# Patient Record
Sex: Male | Born: 1995 | Race: White | Hispanic: No | Marital: Single | Smoking: Never smoker
Health system: Southern US, Community
[De-identification: ages and names within clinical notes are randomized; demographics above are authoritative.]

## PROBLEM LIST (undated history)

## (undated) DIAGNOSIS — J45909 Unspecified asthma, uncomplicated: Secondary | ICD-10-CM

---
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
## 2018-04-07 DIAGNOSIS — Z7289 Other problems related to lifestyle: Secondary | ICD-10-CM | POA: Diagnosis not present

## 2018-04-07 DIAGNOSIS — K219 Gastro-esophageal reflux disease without esophagitis: Secondary | ICD-10-CM | POA: Diagnosis not present

## 2018-04-07 DIAGNOSIS — Z0000 Encounter for general adult medical examination without abnormal findings: Secondary | ICD-10-CM | POA: Diagnosis not present

## 2018-04-07 DIAGNOSIS — Q846 Other congenital malformations of nails: Secondary | ICD-10-CM | POA: Diagnosis not present

## 2019-04-09 DIAGNOSIS — Z130 Encounter for screening for diseases of the blood and blood-forming organs and certain disorders involving the immune mechanism: Secondary | ICD-10-CM | POA: Diagnosis not present

## 2019-04-09 DIAGNOSIS — Z0000 Encounter for general adult medical examination without abnormal findings: Secondary | ICD-10-CM | POA: Diagnosis not present

## 2019-04-13 DIAGNOSIS — Z0000 Encounter for general adult medical examination without abnormal findings: Secondary | ICD-10-CM | POA: Diagnosis not present

## 2019-04-13 DIAGNOSIS — Z1331 Encounter for screening for depression: Secondary | ICD-10-CM | POA: Diagnosis not present

## 2020-01-28 DIAGNOSIS — M62830 Muscle spasm of back: Secondary | ICD-10-CM | POA: Diagnosis not present

## 2020-03-25 DIAGNOSIS — S6991XA Unspecified injury of right wrist, hand and finger(s), initial encounter: Secondary | ICD-10-CM | POA: Diagnosis not present

## 2020-03-25 DIAGNOSIS — S62310A Displaced fracture of base of second metacarpal bone, right hand, initial encounter for closed fracture: Secondary | ICD-10-CM | POA: Diagnosis not present

## 2020-03-25 DIAGNOSIS — S0081XA Abrasion of other part of head, initial encounter: Secondary | ICD-10-CM | POA: Diagnosis not present

## 2020-03-25 DIAGNOSIS — S1091XA Abrasion of unspecified part of neck, initial encounter: Secondary | ICD-10-CM | POA: Diagnosis not present

## 2020-03-25 DIAGNOSIS — Z23 Encounter for immunization: Secondary | ICD-10-CM | POA: Diagnosis not present

## 2020-03-25 DIAGNOSIS — S80811A Abrasion, right lower leg, initial encounter: Secondary | ICD-10-CM | POA: Diagnosis not present

## 2020-03-25 DIAGNOSIS — S80812A Abrasion, left lower leg, initial encounter: Secondary | ICD-10-CM | POA: Diagnosis not present

## 2020-03-27 ENCOUNTER — Ambulatory Visit
Admission: RE | Admit: 2020-03-27 | Discharge: 2020-03-27 | Disposition: A | Discharge status: Home / Self Care | Payer: BC Managed Care – PPO | Source: Ambulatory Visit | Attending: Orthopedic Surgery | Admitting: Orthopedic Surgery

## 2020-03-27 ENCOUNTER — Other Ambulatory Visit: Payer: Self-pay | Admitting: Orthopedic Surgery

## 2020-03-27 DIAGNOSIS — S62310A Displaced fracture of base of second metacarpal bone, right hand, initial encounter for closed fracture: Secondary | ICD-10-CM | POA: Diagnosis not present

## 2020-03-27 DIAGNOSIS — M79641 Pain in right hand: Secondary | ICD-10-CM | POA: Diagnosis not present

## 2020-04-14 DIAGNOSIS — S62310D Displaced fracture of base of second metacarpal bone, right hand, subsequent encounter for fracture with routine healing: Secondary | ICD-10-CM | POA: Diagnosis not present

## 2020-04-14 DIAGNOSIS — M79641 Pain in right hand: Secondary | ICD-10-CM | POA: Diagnosis not present

## 2020-05-05 DIAGNOSIS — S62310D Displaced fracture of base of second metacarpal bone, right hand, subsequent encounter for fracture with routine healing: Secondary | ICD-10-CM | POA: Diagnosis not present

## 2020-05-05 DIAGNOSIS — M79641 Pain in right hand: Secondary | ICD-10-CM | POA: Diagnosis not present

## 2020-06-12 DIAGNOSIS — S62310D Displaced fracture of base of second metacarpal bone, right hand, subsequent encounter for fracture with routine healing: Secondary | ICD-10-CM | POA: Diagnosis not present

## 2020-06-12 DIAGNOSIS — M79641 Pain in right hand: Secondary | ICD-10-CM | POA: Diagnosis not present
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
## 2020-12-22 DIAGNOSIS — R3989 Other symptoms and signs involving the genitourinary system: Secondary | ICD-10-CM | POA: Diagnosis not present

## 2021-01-10 DIAGNOSIS — N4833 Priapism, drug-induced: Secondary | ICD-10-CM | POA: Diagnosis not present

## 2021-01-10 DIAGNOSIS — T40715A Adverse effect of cannabis, initial encounter: Secondary | ICD-10-CM | POA: Diagnosis not present

## 2021-01-10 DIAGNOSIS — N4830 Priapism, unspecified: Secondary | ICD-10-CM | POA: Diagnosis not present
